# Patient Record
Sex: Female | Born: 1965 | Race: Black or African American | Hispanic: No | Smoking: Never smoker
Health system: Southern US, Community
[De-identification: ages and names within clinical notes are randomized; demographics above are authoritative.]

## PROBLEM LIST (undated history)

## (undated) DIAGNOSIS — T7840XA Allergy, unspecified, initial encounter: Secondary | ICD-10-CM

## (undated) HISTORY — PX: ABDOMINAL HYSTERECTOMY: SHX81

## (undated) HISTORY — DX: Allergy, unspecified, initial encounter: T78.40XA

---

## 2000-12-21 ENCOUNTER — Other Ambulatory Visit: Admission: RE | Admit: 2000-12-21 | Discharge: 2000-12-21 | Payer: Self-pay | Admitting: Obstetrics and Gynecology

## 2003-06-26 ENCOUNTER — Other Ambulatory Visit: Admission: RE | Admit: 2003-06-26 | Discharge: 2003-06-26 | Payer: Self-pay | Admitting: Obstetrics and Gynecology

## 2003-09-28 ENCOUNTER — Ambulatory Visit (HOSPITAL_COMMUNITY): Admission: RE | Admit: 2003-09-28 | Discharge: 2003-09-28 | Payer: Self-pay | Admitting: Obstetrics and Gynecology

## 2005-08-05 ENCOUNTER — Encounter: Admission: RE | Admit: 2005-08-05 | Discharge: 2005-08-05 | Payer: Self-pay | Admitting: Obstetrics and Gynecology

## 2006-09-15 ENCOUNTER — Encounter: Admission: RE | Admit: 2006-09-15 | Discharge: 2006-09-15 | Payer: Self-pay | Admitting: Obstetrics and Gynecology

## 2007-02-24 ENCOUNTER — Encounter (INDEPENDENT_AMBULATORY_CARE_PROVIDER_SITE_OTHER): Payer: Self-pay | Admitting: Obstetrics and Gynecology

## 2007-02-24 ENCOUNTER — Ambulatory Visit (HOSPITAL_COMMUNITY): Admission: RE | Admit: 2007-02-24 | Discharge: 2007-02-25 | Payer: Self-pay | Admitting: Obstetrics and Gynecology

## 2008-07-11 ENCOUNTER — Encounter: Admission: RE | Admit: 2008-07-11 | Discharge: 2008-07-11 | Payer: Self-pay | Admitting: Obstetrics and Gynecology

## 2010-09-17 NOTE — H&P (Signed)
NAME:  Stacy Coffey, [place] NO.:  [id number]   MEDICAL RECORD NO.:  [id number]          PATIENT TYPE:  AMB   LOCATION:  SDC                           FACILITY:  [facility]   PHYSICIAN:  Janine Limbo, M.D.DATE OF BIRTH:  [birth year]   DATE OF ADMISSION:  02/24/2007  DATE OF DISCHARGE:                              HISTORY & PHYSICAL   HISTORY OF PRESENT ILLNESS:  Ms. Standish is a 45 year old female, para 1-0-  0-1, who presents for a laparoscopy-assisted vaginal hysterectomy.  The  patient has a known history of fibroids.  An ultrasound was performed  and it showed a 12.5 x 9.3 x 8.9-cm uterus with multiple fibroids.  The  largest fibroid measured 4.83 cm.  Her ovaries appeared normal.  An  endometrial biopsy showed benign elements.  The patient's most recent  Pap smear was within normal limits.  Her mammogram was normal.  The  patient is ready to proceed with definitive therapy.  She also complains  of dysmenorrhea.  The patient received Lupron Depot therapy.   OBSTETRICAL HISTORY:  The patient has had 1 term cesarean section.   PAST MEDICAL HISTORY:  The patient denies hypertension and diabetes.   MEDICATIONS:  She is currently on no medications.   DRUG ALLERGIES:  No drug allergies, Betadine allergies, or latex  allergies.   SOCIAL HISTORY:  The patient denies cigarette use, alcohol use, and  recreational drug use.   REVIEW OF SYSTEMS:  Please see history of present illness.   FAMILY HISTORY:  The patient's mother has hypertension; her mother also  has diabetes.  Her father had a heart attack.  The patient's maternal  aunt had breast cancer at age 82.   PHYSICAL EXAM:  VITAL SIGNS:  Weight is 138 pounds.  Height is 5 feet 4  inches.  HEENT:  Within normal limits.  CHEST:  Clear.  HEART:  Regular rate and rhythm.  BREASTS:  Without masses.  ABDOMEN:  Nontender.  EXTREMITIES:  Grossly normal.  NEUROLOGIC:  Exam is grossly normal.  PELVIC:  External genitalia  are normal.  Vagina is normal.  Cervix is  nontender.  The uterus is 12-week size and nontender.  There is only  limited mobility of the uterus.  Adnexa:  No masses.  Rectovaginal exam  confirms.   ASSESSMENT:  1. Fibroid uterus.  2. Dysmenorrhea.  3. Menorrhagia.  4. Prior cesarean section.  5. Status post Lupron therapy.   PLAN:  The patient will undergo a laparoscopy-assisted vaginal  hysterectomy.  She understands the indications for her surgical  procedure and she accepts the risks of, but not limited to, anesthetic  complications, bleeding, infection, and possible damage to the  surrounding organs.      Janine Limbo, M.D.  Electronically Signed     AVS/MEDQ  D:  02/17/2007  T:  02/18/2007  Job:  [id number]

## 2010-09-17 NOTE — Op Note (Signed)
NAME:  Stacy Coffey, Stacy Coffey                 ACCOUNT NO.:  [id number]   MEDICAL RECORD NO.:  [id number]          PATIENT TYPE:  OIB   LOCATION:  9310                          FACILITY:  [facility]   PHYSICIAN:  Janine Limbo, M.D.DATE OF BIRTH:  1965-09-12   DATE OF PROCEDURE:  02/24/2007  DATE OF DISCHARGE:                               OPERATIVE REPORT   PREOPERATIVE DIAGNOSES:  1. Fourteen-week-size fibroid uterus.  2. Anemia (hemoglobin 11.9).  3. Dysmenorrhea.  4. Menorrhagia.   POSTOPERATIVE DIAGNOSES:  1. Fourteen-week-size fibroid uterus.  2. Anemia (hemoglobin 11.9).  3. Dysmenorrhea.  4. Menorrhagia.   PROCEDURE:  Laparoscopy-assisted vaginal hysterectomy with the uterine  morcellation.   SURGEON:  Leonard Schwartz, MD   FIRST ASSISTANT:  Naima A. Dillard, MD   ANESTHETIC:  General.   DISPOSITION:  Stacy Coffey is a 45 year old female, para 1-0-0-1, who  presents with the above-mentioned diagnosis.  The patient has had one  prior cesarean section.  The patient was treated with Lupron Depot in  preparation for her surgery.  The patient understands the indications  for her surgical procedure and she accepts the risks of, but not limited  to, anesthetic complications, bleeding, infections, and possible damage  to the surrounding organs.   FINDINGS:  A 14-week-size multifibroid uterus was present.  The  estimated uterine weight was 286 g.  The fallopian tubes and ovaries  appeared normal.  The bowel and the remainder of the abdominal contents  appeared normal.   PROCEDURE:  The patient was taken to the operating room, where a general  anesthetic was given.  The patient's abdomen, perineum, and vagina were  prepped with multiple layers of Betadine.  A Foley catheter was placed  in the bladder.  Examination under anesthesia was performed.  A Hulka  tenaculum was placed inside the uterus.  The patient was then sterilely  draped.  The subumbilical area was injected with 4 mL  of 0.5% Marcaine  with epinephrine.  A subumbilical incision was made and the Veress  needle was inserted into the abdominal cavity without difficulty; proper  placement was confirmed using the saline drop test.  A pneumoperitoneum  was then obtained.  A total of 5 mL were used to inject the lower  abdomen with 0.5% Marcaine with epinephrine.  Two small incisions were  made two 5-mm trocars were placed in the lower abdomen under direct  visualization.  Care was taken not to damage any of the abdominal  structures.  There was no evidence of damage to the bowel or other  abdominal contents.  Pictures were taken of the patient's abdominal and  pelvic organs.  The round ligament was identified on the right.  The  round ligament was cauterized and then cut using the bipolar cautery.  The right fallopian tube and the right utero-ovarian ligament were  isolated, cauterized, and then cut.  The bladder flap was developed  anteriorly using the laparoscopic scissors.  An identical procedure was  carried out on the opposite side.  Hemostasis was noted to be adequate.  We had already documented that  the posterior cul-de-sac was free; we  were ready then to proceed with the vaginal portion of the procedure.  The patient was placed in a more lithotomy position.  The cervix was  injected with 20 mL of a diluted solution of Pitressin and saline.  A  circumferential incision was made around the cervix and the vaginal  mucosa was advanced anteriorly and posteriorly.  The anterior cul-de-sac  and then the posterior cul-de-sac were sharply entered.  Alternating  from right to left, the uterosacral ligaments, paracervical tissues,  parametrial tissues, and uterine arteries were clamped, cut, sutured,  and tied securely.  The patient was noted to have a narrow pelvic  outlet.  Attempts were made to invert the uterus through the posterior  colpotomy, but these were unsuccessful.  We therefore amputated the   cervix and began to morcellate the uterus so that we could remove the  uterus.  Hemostasis was maintained throughout the procedure.  We were  able to finally invert the uterus through the posterior colpotomy and  the remaining tissue was clamped and cut.  The uterus was completely  removed.  The upper pedicles were secured using free ties and suture  ligatures of 0 Vicryl.  A check was made for hemostasis and hemostasis  was adequate throughout.  The sutures attached to the uterosacral  ligaments were brought out through the vaginal angles and tied securely.  A McCall culdoplasty suture was placed in the posterior cul-de-sac,  incorporating the uterosacral ligaments bilaterally and the posterior  peritoneum.  The vaginal cuff was closed using figure-of-eight sutures,  incorporating the anterior vaginal mucosa, the anterior peritoneum, the  posterior peritoneum, and the posterior vaginal mucosa.  The [place] cul-  de-sac culdoplasty suture was tied securely and the apex of the vagina  was noted to elevate into the mid pelvis.  Sponge, needle, and  instrument counts were correct on 2 occasions.  The estimated blood loss  for the procedure was 150 mL.  The operator then changed gown and  gloves.  The pneumoperitoneum was reestablished and the pelvis was  carefully inspected.  The pelvis was irrigated.  Hemostasis was  adequate.  The suprapubic trocars were removed under direct  visualization.  The subumbilical trocar was then removed after the  pneumoperitoneum was allowed to escape.  The fascia of the subumbilical  incision was closed using interrupted sutures of 0 Vicryl.  The skin of  all incisions were closed using 4-0 Monocryl.  The patient tolerated her  procedure well.  She was noted to drain clear yellow urine at the end of  her procedure.  The patient was taken to the recovery room in stable  condition.  She tolerated her procedure well.  There was some elevation  in her blood  pressure.  The uterus was sent to Pathology.      Janine Limbo, M.D.  Electronically Signed     AVS/MEDQ  D:  02/24/2007  T:  02/25/2007  Job:  [id number]

## 2010-09-20 NOTE — Discharge Summary (Signed)
NAME:  Stacy Coffey, Stacy Coffey                 ACCOUNT NO.:  [id number]   MEDICAL RECORD NO.:  [id number]          PATIENT TYPE:  OIB   LOCATION:  9310                          [facility]ACILITY:  [facility]   PHYSICIAN:  Janine Limbo, M.D.DATE OF BIRTH:  February 08, 1966   DATE OF ADMISSION:  02/24/2007  DATE OF DISCHARGE:  02/25/2007                               DISCHARGE SUMMARY   DISCHARGE DIAGNOSIS:  1. Symptomatic uterine fibroids.  2. Anemia (hemoglobin 11.9).  3. Dysmenorrhea and menorrhagia.   OPERATION:  On the day of admission, the patient underwent a  laparoscopically-assisted vaginal hysterectomy, tolerating procedures  well.  The patient had removed a 286 gram multifibroid uterus which was  sent to pathology.   HISTORY OF PRESENT ILLNESS:  Stacy Coffey is a 45 year old female, para 1-0-  0-1, who presents for hysterectomy because of symptomatic uterine  fibroids.  Please see the patient's dictated History and Physical  examination for details.   Preoperative physical exam, weight is 138 pounds, height is 5 feet 4  inches.  General exam was within normal limits.  Pelvic exam:  External  genitalia was normal.  Vagina was normal. Cervix was nontender.  Uterus  was 12-week size and nontender.  There was limited mobility of the  uterus noted. Her adnexa was without masses, and her rectovaginal exam  confirms.   HOSPITAL COURSE:  On the day of admission, the patient underwent  aforementioned procedures, tolerating them all well.  By postop day #1,  the patient had resumed bowel and bladder function and was tolerating a  hemoglobin of 9.16 (preop hemoglobin 11.9) and, therefore, was deemed  ready for discharge home.   DISCHARGE MEDICATIONS:  1. Vicodin one to two tablets every 4 hours as needed for pain.  2. Motrin 800 mg 1 tablet every 8 hours with food as needed for pain.  3. Zofran 4 mg 1 tablet every 4 hours as needed for nausea.  4. Iron 325 mg 1 tablet twice daily for 6 weeks.   FOLLOWUP:   The patient is to call [facility] at [phone] to  schedule a 6-week postoperative visit with Dr. Stefano Gaul   DISCHARGE INSTRUCTIONS:  The patient was given a copy of [facility] postoperative instruction sheet.  She was further  advised to avoid driving for 1 week, heavy lifting for 4 weeks,  intercourse for 6  weeks.  She may shower.  She may walk up steps.  She may increase her  activities slowly.  Her diet was without restriction.   Final pathology was not available at the time of this dictation.      Stacy Coffey.      Janine Limbo, M.D.  Electronically Signed    EJP/MEDQ  D:  03/22/2007  T:  03/22/2007  Job:  [id number]

## 2011-02-12 LAB — CBC
HCT: 37.1
MCHC: 32.2
MCHC: 32.4
MCV: 71.3 — ABNORMAL LOW
RBC: 5.2 — ABNORMAL HIGH
RDW: 21.9 — ABNORMAL HIGH
WBC: 10.2

## 2011-02-12 LAB — URINALYSIS, ROUTINE W REFLEX MICROSCOPIC
Hgb urine dipstick: NEGATIVE
Nitrite: NEGATIVE
Protein, ur: NEGATIVE
Specific Gravity, Urine: 1.02

## 2011-02-12 LAB — PREGNANCY, URINE: Preg Test, Ur: NEGATIVE

## 2011-05-15 ENCOUNTER — Other Ambulatory Visit: Payer: Self-pay | Admitting: Obstetrics and Gynecology

## 2011-05-15 DIAGNOSIS — Z1231 Encounter for screening mammogram for malignant neoplasm of breast: Secondary | ICD-10-CM

## 2011-05-27 ENCOUNTER — Ambulatory Visit
Admission: RE | Admit: 2011-05-27 | Discharge: 2011-05-27 | Disposition: A | Discharge status: Home / Self Care | Payer: BC Managed Care – PPO | Source: Ambulatory Visit | Attending: Obstetrics and Gynecology | Admitting: Obstetrics and Gynecology

## 2011-05-27 DIAGNOSIS — Z1231 Encounter for screening mammogram for malignant neoplasm of breast: Secondary | ICD-10-CM

## 2012-06-07 ENCOUNTER — Other Ambulatory Visit: Payer: Self-pay | Admitting: Obstetrics and Gynecology

## 2012-06-07 DIAGNOSIS — Z1231 Encounter for screening mammogram for malignant neoplasm of breast: Secondary | ICD-10-CM

## 2012-06-28 ENCOUNTER — Ambulatory Visit
Admission: RE | Admit: 2012-06-28 | Discharge: 2012-06-28 | Disposition: A | Discharge status: Home / Self Care | Payer: BC Managed Care – PPO | Source: Ambulatory Visit | Attending: Obstetrics and Gynecology | Admitting: Obstetrics and Gynecology

## 2012-07-13 ENCOUNTER — Encounter: Payer: Self-pay | Admitting: Family Medicine

## 2012-07-13 ENCOUNTER — Ambulatory Visit: Payer: BC Managed Care – PPO | Admitting: Family Medicine

## 2012-07-13 VITALS — BP 100/82 | HR 68 | Resp 16 | Ht 64.0 in | Wt 140.0 lb

## 2012-07-13 DIAGNOSIS — Z0000 Encounter for general adult medical examination without abnormal findings: Secondary | ICD-10-CM

## 2012-07-13 DIAGNOSIS — N951 Menopausal and female climacteric states: Secondary | ICD-10-CM

## 2012-07-13 MED ORDER — ESTRADIOL 0.5 MG PO TABS: 0.5000 mg | ORAL_TABLET | Freq: Every day | ORAL | Status: DC

## 2012-07-13 NOTE — Patient Instructions (Addendum)
Menopause Menopause is the normal time of life when menstrual periods stop completely. Menopause is complete when you have missed 12 consecutive menstrual periods. It usually occurs between the ages of 48 to 55, with an average age of 51. Very rarely does a woman develop menopause before 48 years old. At menopause, your ovaries stop producing the female hormones, estrogen and progesterone. This can cause undesirable symptoms and also affect your health. Sometimes the symptoms may occur 4 to 5 years before the menopause begins. There is no relationship between menopause and:  Oral contraceptives.  Number of children you had.  Race.  The age your menstrual periods started (menarche). Heavy smokers and very thin women may develop menopause earlier in life. CAUSES  The ovaries stop producing the female hormones estrogen and progesterone.  Other causes include:  Surgery to remove both ovaries.  The ovaries stop functioning for no known reason.  Tumors of the pituitary gland in the brain.  Medical disease that affects the ovaries and hormone production.  Radiation treatment to the abdomen or pelvis.  Chemotherapy that affects the ovaries. SYMPTOMS   Hot flashes.  Night sweats.  Decrease in sex drive.  Vaginal dryness and thinning of the vagina causing painful intercourse.  Dryness of the skin and developing wrinkles.  Headaches.  Tiredness.  Irritability.  Memory problems.  Weight gain.  Bladder infections.  Hair growth of the face and chest.  Infertility. More serious symptoms include:  Loss of bone (osteoporosis) causing breaks (fractures).  Depression.  Hardening and narrowing of the arteries (atherosclerosis) causing heart attacks and strokes. DIAGNOSIS   When the menstrual periods have stopped for 12 straight months.  Physical exam.  Hormone studies of the blood. TREATMENT  There are many treatment choices and nearly as many questions about them.  The decisions to treat or not to treat menopausal changes is an individual choice made with your caregiver. Your caregiver can discuss the treatments with you. Together, you can decide which treatment will work best for you. Your treatment choices may include:   Hormone therapy (estorgen and progesterone).  Non-hormonal medications.  Treating the individual symptoms with medication (for example antidepressants for depression).  Herbal medications that may help specific symptoms.  Counseling by a psychiatrist or psychologist.  Group therapy.  Lifestyle changes including:  Eating healthy.  Regular exercise.  Limiting caffeine and alcohol.  Stress management and meditation.  No treatment. HOME CARE INSTRUCTIONS   Take the medication your caregiver gives you as directed.  Get plenty of sleep and rest.  Exercise regularly.  Eat a diet that contains calcium (good for the bones) and soy products (acts like estrogen hormone).  Avoid alcoholic beverages.  Do not smoke.  If you have hot flashes, dress in layers.  Take supplements, calcium and vitamin D to strengthen bones.  You can use over-the-counter lubricants or moisturizers for vaginal dryness.  Group therapy is sometimes very helpful.  Acupuncture may be helpful in some cases. SEEK MEDICAL CARE IF:   You are not sure you are in menopause.  You are having menopausal symptoms and need advice and treatment.  You are still having menstrual periods after age 55.  You have pain with intercourse.  Menopause is complete (no menstrual period for 12 months) and you develop vaginal bleeding.  You need a referral to a specialist (gynecologist, psychiatrist or psychologist) for treatment. SEEK IMMEDIATE MEDICAL CARE IF:   You have severe depression.  You have excessive vaginal bleeding.  You fell and   think you have a broken bone.  You have pain when you urinate.  You develop leg or chest pain.  You have a fast  pounding heart beat (palpitations).  You have severe headaches.  You develop vision problems.  You feel a lump in your breast.  You have abdominal pain or severe indigestion. Document Released: 07/12/2003 Document Revised: 07/14/2011 Document Reviewed: 02/17/2008 [facility] Patient Information 2013 [place], [place].    Estradiol tablets What is this medicine? ESTRADIOL (es tra DYE ole) is an estrogen. It is mostly used as hormone replacement in menopausal women. It helps to treat hot flashes and prevent osteoporosis. It is also used to treat women with low estrogen levels or those who have had their ovaries removed. This medicine may be used for other purposes; ask your health care provider or pharmacist if you have questions. What should I tell my health care provider before I take this medicine? They need to know if you have or ever had any of these conditions: -abnormal vaginal bleeding -blood vessel disease or blood clots -breast, cervical, endometrial, ovarian, liver, or uterine cancer -dementia -diabetes -gallbladder disease -heart disease or recent heart attack -high blood pressure -high cholesterol -high level of calcium in the blood -hysterectomy -kidney disease -liver disease -migraine headaches -protein C deficiency -protein S deficiency -stroke -systemic lupus erythematosus (SLE) -tobacco smoker -an unusual or allergic reaction to estrogens, other hormones, medicines, foods, dyes, or preservatives -pregnant or trying to get pregnant -breast-feeding How should I use this medicine? Take this medicine by mouth. To reduce nausea, this medicine may be taken with food. Follow the directions on the prescription label. Take this medicine at the same time each day and in the order directed on the package. Do not take your medicine more often than directed. Contact your pediatrician regarding the use of this medicine in children. Special care may be needed. A patient  package insert for the product will be given with each prescription and refill. Read this sheet carefully each time. The sheet may change frequently. Overdosage: If you think you have taken too much of this medicine contact a poison control center or emergency room at once. NOTE: This medicine is only for you. Do not share this medicine with others. What if I miss a dose? If you miss a dose, take it as soon as you can. If it is almost time for your next dose, take only that dose. Do not take double or extra doses. What may interact with this medicine? Do not take this medicine with any of the following medications: -aromatase inhibitors like aminoglutethimide, anastrozole, exemestane, letrozole, testolactone This medicine may also interact with the following medications: -carbamazepine -certain antibiotics used to treat infections -certain barbiturates or benzodiazepines used for inducing sleep or treating seizures -grapefruit juice -medicines for fungus infections like itraconazole and ketoconazole -raloxifene or tamoxifen -rifabutin, rifampin, or rifapentine -ritonavir -St. John's Wort -warfarin This list may not describe all possible interactions. Give your health care provider a list of all the medicines, herbs, non-prescription drugs, or dietary supplements you use. Also tell them if you smoke, drink alcohol, or use illegal drugs. Some items may interact with your medicine. What should I watch for while using this medicine? Visit your doctor or health care professional for regular checks on your progress. You will need a regular breast and pelvic exam and Pap smear while on this medicine. You should also discuss the need for regular mammograms with your health care professional, and follow his or her  guidelines for these tests. This medicine can make your body retain fluid, making your fingers, hands, or ankles swell. Your blood pressure can go up. Contact your doctor or health care  professional if you feel you are retaining fluid. If you have any reason to think you are pregnant, stop taking this medicine right away and contact your doctor or health care professional. Smoking increases the risk of getting a blood clot or having a stroke while you are taking this medicine, especially if you are more than 48 years old. You are strongly advised not to smoke. If you wear contact lenses and notice visual changes, or if the lenses begin to feel uncomfortable, consult your eye doctor or health care professional. This medicine can increase the risk of developing a condition (endometrial hyperplasia) that may lead to cancer of the lining of the uterus. Taking progestins, another hormone drug, with this medicine lowers the risk of developing this condition. Therefore, if your uterus has not been removed (by a hysterectomy), your doctor may prescribe a progestin for you to take together with your estrogen. You should know, however, that taking estrogens with progestins may have additional health risks. You should discuss the use of estrogens and progestins with your health care professional to determine the benefits and risks for you. If you are going to have surgery, you may need to stop taking this medicine. Consult your health care professional for advice before you schedule the surgery. What side effects may I notice from receiving this medicine? Side effects that you should report to your doctor or health care professional as soon as possible: -allergic reactions like skin rash, itching or hives, swelling of the face, lips, or tongue -breast tissue changes or discharge -changes in vision -chest pain -confusion, trouble speaking or understanding -dark urine -general ill feeling or flu-like symptoms -light-colored stools -nausea, vomiting -pain, swelling, warmth in the leg -right upper belly pain -severe headaches -shortness of breath -sudden numbness or weakness of the face, arm  or leg -trouble walking, dizziness, loss of balance or coordination -unusual vaginal bleeding -yellowing of the eyes or skin Side effects that usually do not require medical attention (report to your doctor or health care professional if they continue or are bothersome): -hair loss -increased hunger or thirst -increased urination -symptoms of vaginal infection like itching, irritation or unusual discharge -unusually weak or tired This list may not describe all possible side effects. Call your doctor for medical advice about side effects. You may report side effects to FDA at 1-800-FDA-1088. Where should I keep my medicine? Keep out of the reach of children. Store at room temperature between 20 and 25 degrees C (68 and 77 degrees F). Keep container tightly closed. Protect from light. Throw away any unused medicine after the expiration date. NOTE: This sheet is a summary. It may not cover all possible information. If you have questions about this medicine, talk to your doctor, pharmacist, or health care provider.  2012, [employer]. (07/24/2010 9:19:24 AM)

## 2012-07-13 NOTE — Progress Notes (Signed)
Last Pap :2008 ? WNL: Yes Regular Periods:Hysterectomy Contraception: N/A  Monthly Breast exam:yes Tetanus<2yrs:no Nl.Bladder Function:yes Daily BMs:yes Healthy Diet:yes Calcium:yes Mammogram:yes Date of Mammogram:06/2012 WNL Exercise:Yes How often Exercise: 2 x wk Seat belt: yes Abuse at home: no Stressful work:no Sigmoid-colonoscopy: None Bone Density: No PCP: None Change PMH: None Change in ZOX:WRUE  Subjective:    Stacy Coffey is a 47 y.o. female, No obstetric history on file., who presents for an annual exam. The patient reports hot/cold flashes, night sweats, mood swings, and insomnia x 3 years. Denies vaginal dryness or dyspareunia, decreased libido.     Menstrual cycle:   LMP: No LMP recorded. Patient has had a hysterectomy.             Review of Systems Pertinent items are noted in HPI (negative for hx of CVD, cholesteremia, breast cancer, thrombosis, endometrial carcinoma, liver disease). Denies pelvic pain, urinary tract symptoms, vaginitis symptoms, irregular bleeding, change in bowel habits or rectal bleeding.  Mammogram 06/2012 and negative per patient.     Objective:    BP 100/82  Pulse 68  Resp 16  Ht 5\' 4"  (1.626 m)  Wt 140 lb (63.504 kg)  BMI 24.02 kg/m2   Wt Readings from Last 1 Encounters:  07/13/12 140 lb (63.504 kg)   Body mass index is 24.02 kg/(m^2). General Appearance: Alert, no acute distress HEENT: Grossly normal Neck / Thyroid: Supple, no thyromegaly or cervical adenopathy Lungs: Clear to auscultation bilaterally Back: No CVA tenderness Breast Exam: No masses or nodes.  No dimpling, nipple retraction or discharge.  Left breast thickening at 5 o'clock, non tender. Cardiovascular: Regular rate and rhythm. S1+S2 present without M/G/R. Gastrointestinal: Soft, non-tender, no masses or organomegaly. Pelvic Exam: Patient shaves, sparse amount of grey hair on labia majoria,  EGBUS-wnl, vagina-decreased rugae, cervix-absent (surgically removed),   Uterus-absent (surgically removed), adnexae-no masses or tenderness, vaginal thin, pale mucosa. Rectovaginal: no masses and normal sphincter tone Lymphatic Exam: Non-palpable nodes in neck, clavicular,  axillary, or inguinal regions  Skin: no rashes or abnormalities Extremities: no clubbing cyanosis or edema  Neurologic: grossly normal Psychiatric: Alert and oriented    Assessment:   1. Routine GYN Exam 2. Menopause   Plan:    1. AEX in 1 year 2. RTO 12 weeks for f/u menopause, TSH, FSH, LH, Lipid panel, and Vitamin D pending.    CARTER, LAKASHAFNP-BC

## 2012-07-14 ENCOUNTER — Telehealth: Payer: Self-pay | Admitting: Obstetrics and Gynecology

## 2012-07-14 LAB — LIPID PANEL
Cholesterol: 218 mg/dL — ABNORMAL HIGH (ref 0–200)
HDL: 79 mg/dL (ref >39)
LDL Cholesterol: 128 mg/dL — ABNORMAL HIGH (ref 0–99)
Total CHOL/HDL Ratio: 2.8 ratio
Triglycerides: 57 mg/dL (ref <150)
VLDL: 11 mg/dL (ref 0–40)

## 2012-07-14 LAB — LUTEINIZING HORMONE: LH: 54.3 m[IU]/mL

## 2012-07-14 LAB — FOLLICLE STIMULATING HORMONE: FSH: 108.4 m[IU]/mL

## 2012-07-14 LAB — TSH: TSH: 1.468 u[IU]/mL (ref 0.350–4.500)

## 2012-07-14 NOTE — Telephone Encounter (Signed)
TC to pt. Per [facility] informed lab indicates is menopausal and to continue  Estradiol. Also informed cholesterol slightly elevated and discussed lifestyle changes and diet. Pt will obtain additional info on line. To repeat labs at [facility]. Pt verbalizes comprehension.

## 2012-07-14 NOTE — Telephone Encounter (Signed)
Message copied by Mason Jim on Wed Jul 14, 2012  9:31 AM ------      Message from: Elane Fritz      Created: Wed Jul 14, 2012  8:43 AM       1. Labs confirm menopause. Continue her estradiol.            2. Pt cholesterol levels slightly elevated.  Encourage lifestyle modifications (exercising, limit fats, sweets, and high-fat desserts).  If she will like we can mail her some information on high cholesterol.  Will recheck labs at next visit.            L.Carter, FNP-BC ------

## 2013-08-19 ENCOUNTER — Other Ambulatory Visit: Payer: Self-pay

## 2013-08-19 DIAGNOSIS — Z1231 Encounter for screening mammogram for malignant neoplasm of breast: Secondary | ICD-10-CM

## 2013-08-29 ENCOUNTER — Ambulatory Visit
Admission: RE | Admit: 2013-08-29 | Discharge: 2013-08-29 | Disposition: A | Discharge status: Home / Self Care | Payer: 59 | Source: Ambulatory Visit

## 2013-08-29 ENCOUNTER — Encounter (INDEPENDENT_AMBULATORY_CARE_PROVIDER_SITE_OTHER): Payer: Self-pay

## 2013-08-29 DIAGNOSIS — Z1231 Encounter for screening mammogram for malignant neoplasm of breast: Secondary | ICD-10-CM

## 2014-02-17 ENCOUNTER — Other Ambulatory Visit: Payer: Self-pay

## 2015-06-25 ENCOUNTER — Other Ambulatory Visit: Payer: Self-pay

## 2015-06-25 DIAGNOSIS — Z1231 Encounter for screening mammogram for malignant neoplasm of breast: Secondary | ICD-10-CM

## 2015-07-10 ENCOUNTER — Ambulatory Visit
Admission: RE | Admit: 2015-07-10 | Discharge: 2015-07-10 | Disposition: A | Discharge status: Home / Self Care | Payer: BLUE CROSS/BLUE SHIELD | Source: Ambulatory Visit

## 2015-07-10 DIAGNOSIS — Z1231 Encounter for screening mammogram for malignant neoplasm of breast: Secondary | ICD-10-CM

## 2016-09-30 ENCOUNTER — Encounter: Payer: Self-pay | Admitting: Gastroenterology

## 2016-10-07 ENCOUNTER — Ambulatory Visit (AMBULATORY_SURGERY_CENTER): Payer: Self-pay | Admitting: *Deleted

## 2016-10-07 ENCOUNTER — Encounter: Payer: Self-pay | Admitting: Gastroenterology

## 2016-10-07 VITALS — Ht 64.5 in | Wt 131.0 lb

## 2016-10-07 DIAGNOSIS — Z1211 Encounter for screening for malignant neoplasm of colon: Secondary | ICD-10-CM

## 2016-10-07 MED ORDER — NA SULFATE-K SULFATE-MG SULF 17.5-3.13-1.6 GM/177ML PO SOLN: ORAL | 0 refills | Status: DC

## 2016-10-07 NOTE — Progress Notes (Signed)
Pt denies allergies to eggs or soy products. Denies difficulty with sedation or anesthesia. Denies any diet or weight loss medications. Denies use of supplemental oxygen.  Emmi instructions given for procedure.  

## 2016-10-20 ENCOUNTER — Encounter: Payer: Self-pay | Admitting: Gastroenterology

## 2016-10-20 ENCOUNTER — Ambulatory Visit (AMBULATORY_SURGERY_CENTER): Payer: BLUE CROSS/BLUE SHIELD | Admitting: Gastroenterology

## 2016-10-20 VITALS — BP 120/78 | HR 54 | Temp 97.8°F | Resp 12 | Ht 64.5 in | Wt 131.0 lb

## 2016-10-20 DIAGNOSIS — Z1212 Encounter for screening for malignant neoplasm of rectum: Secondary | ICD-10-CM | POA: Diagnosis not present

## 2016-10-20 DIAGNOSIS — Z1211 Encounter for screening for malignant neoplasm of colon: Secondary | ICD-10-CM | POA: Diagnosis not present

## 2016-10-20 MED ORDER — SODIUM CHLORIDE 0.9 % IV SOLN: 500.0000 mL | INTRAVENOUS | Status: AC

## 2016-10-20 NOTE — Progress Notes (Signed)
No problems noted in the recovery room. maw 

## 2016-10-20 NOTE — Progress Notes (Signed)
Report given to PACU, vss 

## 2016-10-20 NOTE — Progress Notes (Signed)
Pt's states no medical or surgical changes since previsit or office visit. 

## 2016-10-20 NOTE — Op Note (Signed)
[facility] Patient Name: Stacy Coffey Procedure Date: 10/20/2016 8:20 AM MRN: [id number] Endoscopist: Sherilyn Cooter L. Myrtie Neither , MD Age: 51 Referring MD:  Date of Birth: 1965-06-16 Gender: Female Account #: [id number] Procedure:                Colonoscopy Indications:              Screening for colorectal malignant neoplasm, This                            is the patient's first colonoscopy Medicines:                Monitored Anesthesia Care Procedure:                Pre-Anesthesia Assessment:                           - Prior to the procedure, a History and Physical                            was performed, and patient medications and                            allergies were reviewed. The patient's tolerance of                            previous anesthesia was also reviewed. The risks                            and benefits of the procedure and the sedation                            options and risks were discussed with the patient.                            All questions were answered, and informed consent                            was obtained. Prior Anticoagulants: The patient has                            taken no previous anticoagulant or antiplatelet                            agents. ASA Grade Assessment: I - A normal, healthy                            patient. After reviewing the risks and benefits,                            the patient was deemed in satisfactory condition to                            undergo the procedure.  After obtaining informed consent, the colonoscope                            was passed under direct vision. Throughout the                            procedure, the patient's blood pressure, pulse, and                            oxygen saturations were monitored continuously. The                            Colonoscope was introduced through the anus and                            advanced to the the cecum, identified by                             appendiceal orifice and ileocecal valve. The                            ileocecal valve, appendiceal orifice, and rectum                            were photographed. The quality of the bowel                            preparation was excellent. The colonoscopy was                            performed without difficulty. The patient tolerated                            the procedure well. The bowel preparation used was                            SUPREP. Scope In: 8:24:18 AM Scope Out: 8:38:12 AM Scope Withdrawal Time: 0 hours 9 minutes 19 seconds  Total Procedure Duration: 0 hours 13 minutes 54 seconds  Findings:                 The entire examined colon appeared normal on direct                            and retroflexion views. Complications:            No immediate complications. Estimated blood loss:                            None. Estimated Blood Loss:     Estimated blood loss: none. Recommendation:           - Repeat colonoscopy in 10 years for screening                            purposes.                           -  Patient has a contact number available for                            emergencies. The signs and symptoms of potential                            delayed complications were discussed with the                            patient. Return to normal activities tomorrow.                            Written discharge instructions were provided to the                            patient.                           - Resume previous diet.                           - Continue present medications. Bradely Rudin L. Myrtie Neither, MD 10/20/2016 8:41:35 AM This report has been signed electronically.

## 2016-10-20 NOTE — Patient Instructions (Signed)
YOU HAD AN ENDOSCOPIC PROCEDURE TODAY AT THE [facility]:   Refer to the procedure report that was given to you for any specific questions about what was found during the examination.  If the procedure report does not answer your questions, please call your gastroenterologist to clarify.  If you requested that your care partner not be given the details of your procedure findings, then the procedure report has been included in a sealed envelope for you to review at your convenience later.  YOU SHOULD EXPECT: Some feelings of bloating in the abdomen. Passage of more gas than usual.  Walking can help get rid of the air that was put into your GI tract during the procedure and reduce the bloating. If you had a lower endoscopy (such as a colonoscopy or flexible sigmoidoscopy) you may notice spotting of blood in your stool or on the toilet paper. If you underwent a bowel prep for your procedure, you may not have a normal bowel movement for a few days.  Please Note:  You might notice some irritation and congestion in your nose or some drainage.  This is from the oxygen used during your procedure.  There is no need for concern and it should clear up in a day or so.  SYMPTOMS TO REPORT IMMEDIATELY:   Following lower endoscopy (colonoscopy or flexible sigmoidoscopy):  Excessive amounts of blood in the stool  Significant tenderness or worsening of abdominal pains  Swelling of the abdomen that is new, acute  Fever of 100F or higher   For urgent or emergent issues, a gastroenterologist can be reached at any hour by calling [phone].   DIET:  We do recommend a small meal at first, but then you may proceed to your regular diet.  Drink plenty of fluids but you should avoid alcoholic beverages for 24 hours.  ACTIVITY:  You should plan to take it easy for the rest of today and you should NOT DRIVE or use heavy machinery until tomorrow (because of the sedation medicines used during the test).     FOLLOW UP: Our staff will call the number listed on your records the next business day following your procedure to check on you and address any questions or concerns that you may have regarding the information given to you following your procedure. If we do not reach you, we will leave a message.  However, if you are feeling well and you are not experiencing any problems, there is no need to return our call.  We will assume that you have returned to your regular daily activities without incident.  If any biopsies were taken you will be contacted by phone or by letter within the next 1-3 weeks.  Please call us at [phone] if you have not heard about the biopsies in 3 weeks.    SIGNATURES/CONFIDENTIALITY: You and/or your care partner have signed paperwork which will be entered into your electronic medical record.  These signatures attest to the fact that that the information above on your After Visit Summary has been reviewed and is understood.  Full responsibility of the confidentiality of this discharge information lies with you and/or your care-partner.    Repeat screening colonoscopy in 10 years. You may resume your current medications today. Please call if any questions or concerns.

## 2016-10-21 ENCOUNTER — Telehealth: Payer: Self-pay

## 2016-10-21 NOTE — Telephone Encounter (Signed)
  Follow up Call-  Call back number 10/20/2016  Post procedure Call Back phone  # [phone]  Permission to leave phone message Yes  Some recent data might be hidden     Patient questions:  Do you have a fever, pain , or abdominal swelling? No. Pain Score  0 *  Have you tolerated food without any problems? Yes.    Have you been able to return to your normal activities? Yes.    Do you have any questions about your discharge instructions: Diet   No. Medications  No. Follow up visit  No.  Do you have questions or concerns about your Care? No.  Actions: * If pain score is 4 or above: No action needed, pain <4.  No problems noted per pt. maw

## 2016-11-21 IMAGING — MG MM SCREEN MAMMOGRAM BILATERAL
6 series · 6 of 6 positions shown · non-contrast
Comparison: Previous exam(s).

CLINICAL DATA: Screening.

EXAM:
DIGITAL SCREENING BILATERAL MAMMOGRAM WITH CAD

[R CC]
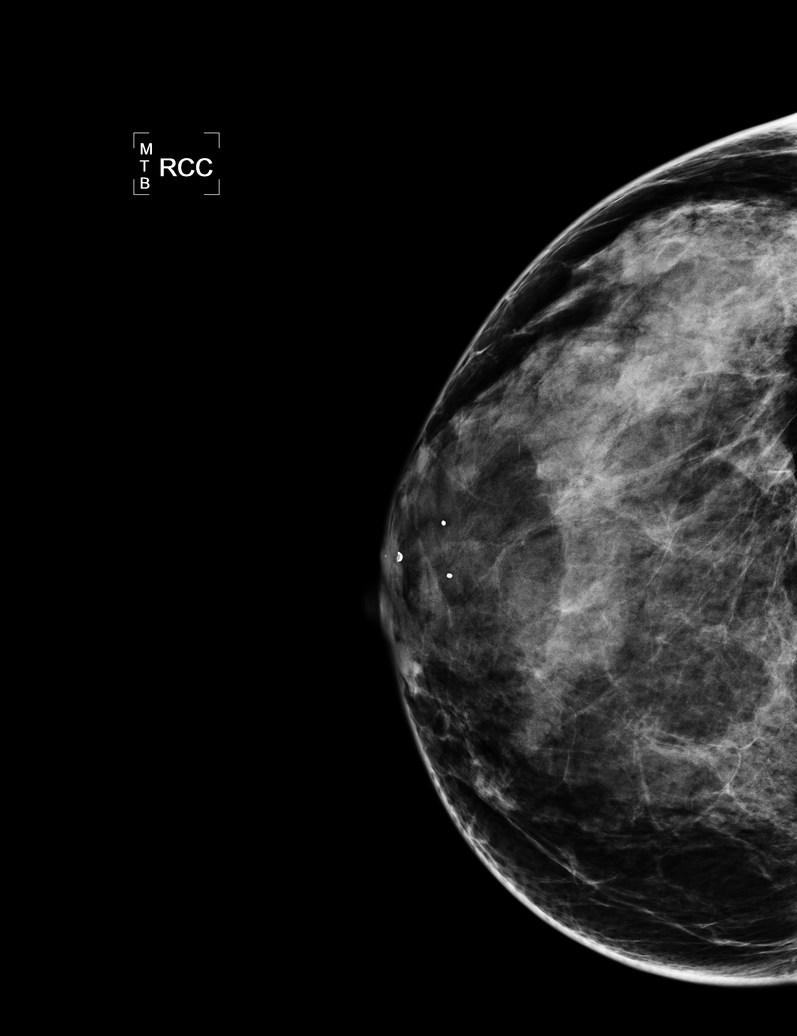

[L CC]
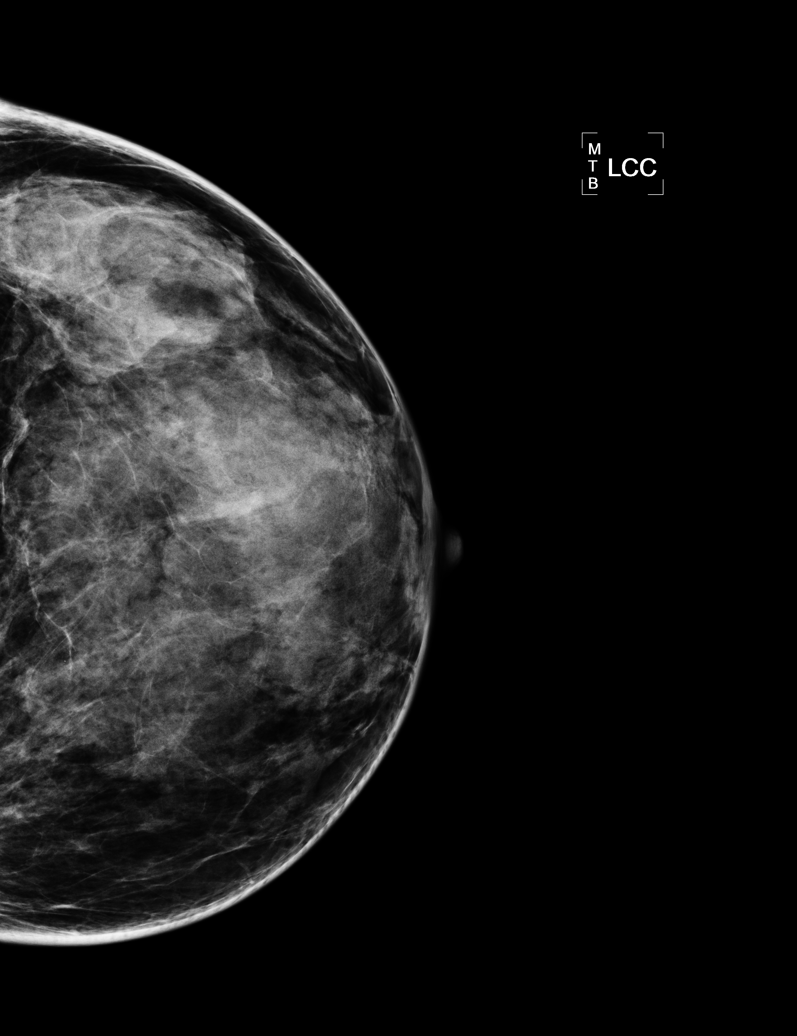

[L MLO (1 of 2)]
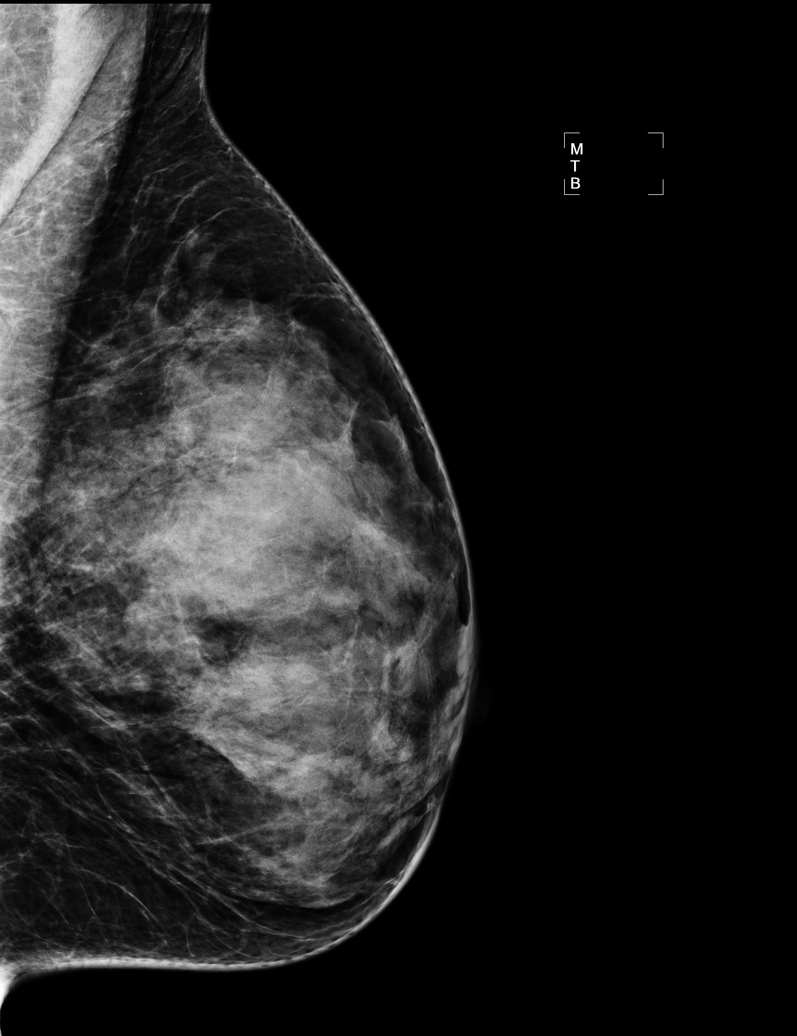

[R MLO (1 of 2)]
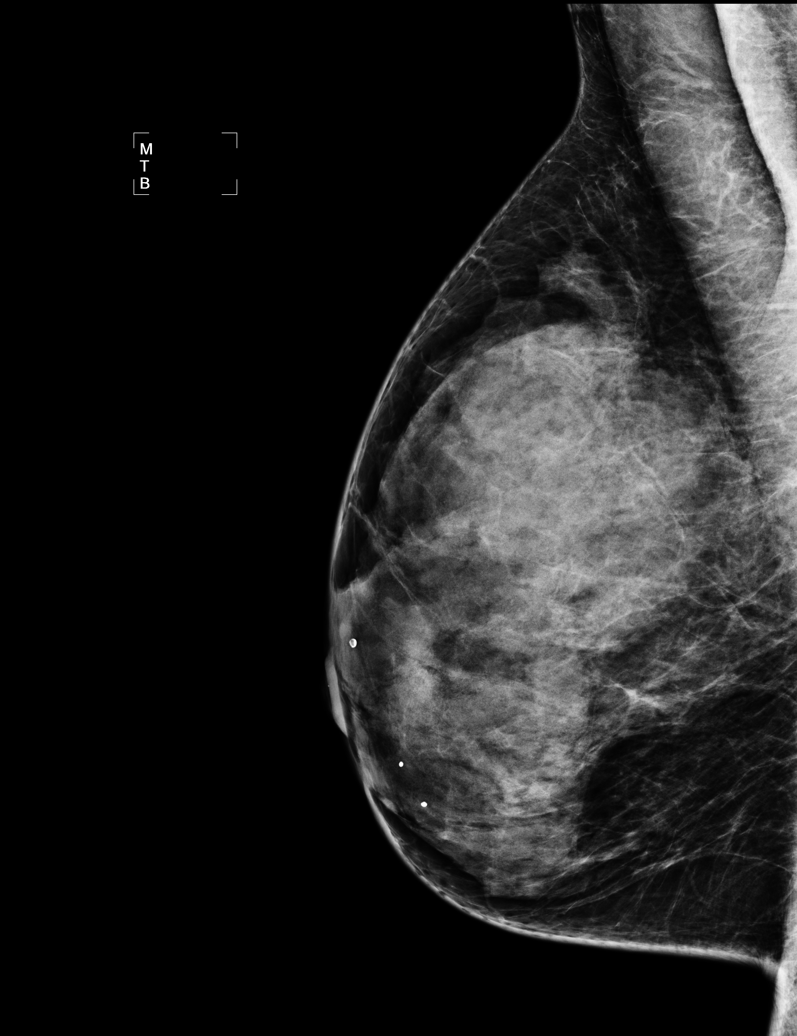

[R MLO (2 of 2)]
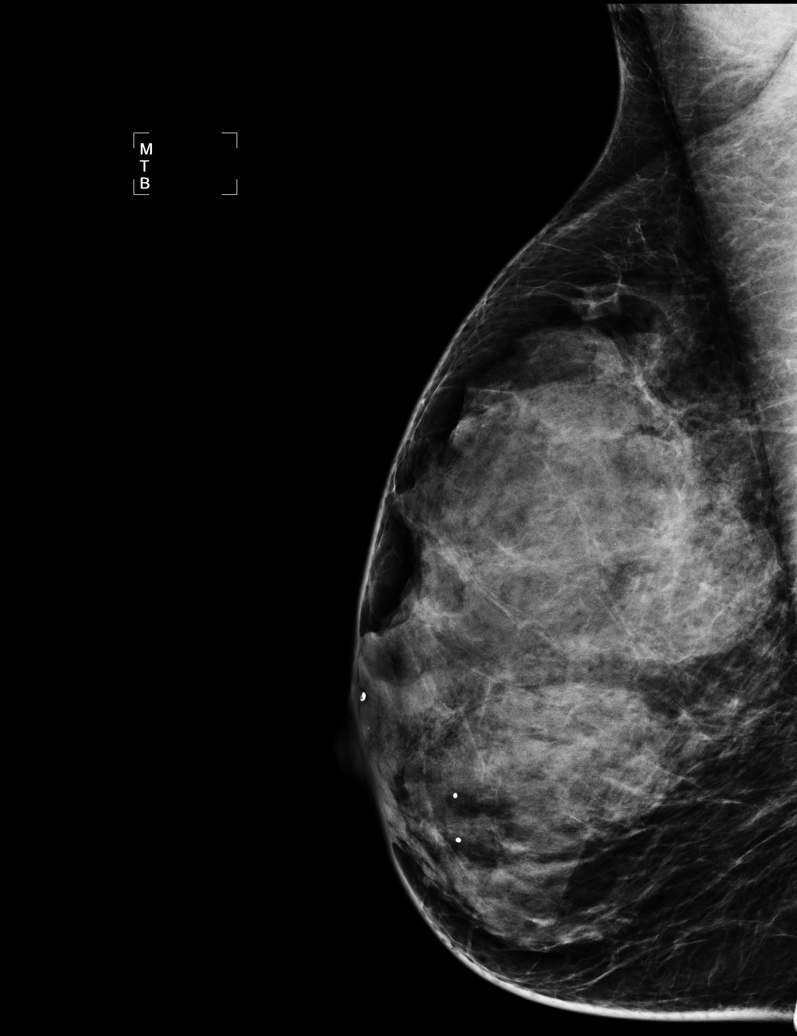

[L MLO (2 of 2)]
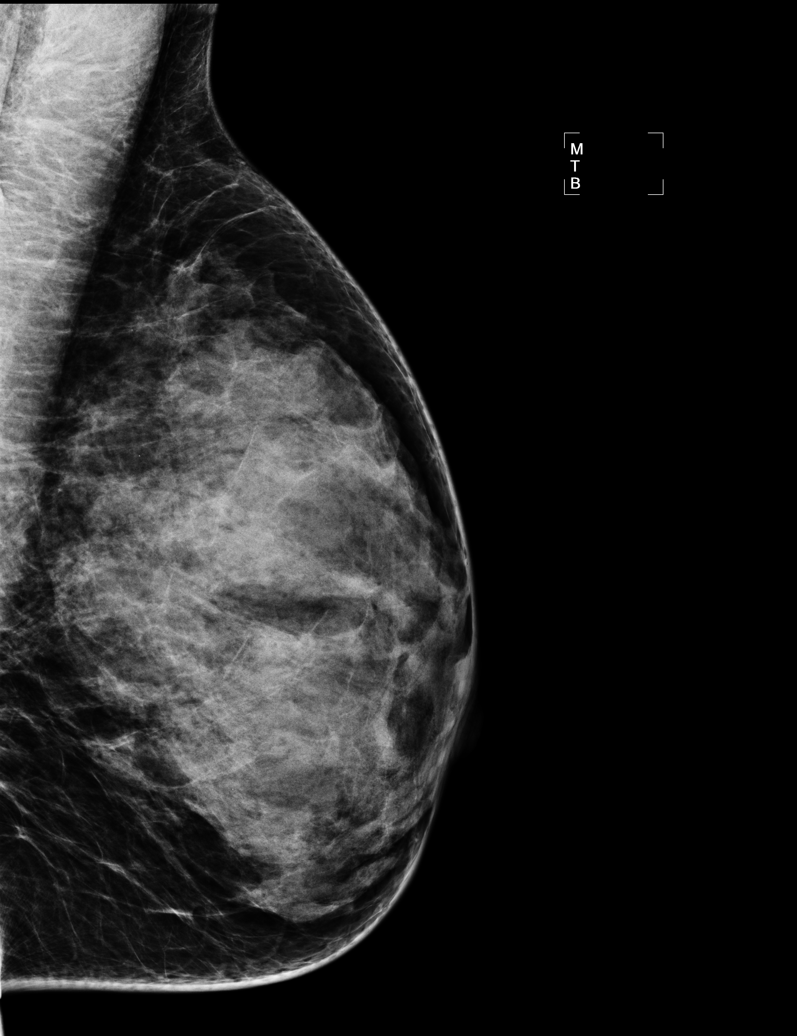

[6 of 6 positions shown; findings below may reference images not displayed]

ACR Breast Density Category d: The breast tissue is extremely dense,
which lowers the sensitivity of mammography.
FINDINGS: There are no findings suspicious for malignancy. Images were
processed with CAD.
IMPRESSION: No mammographic evidence of malignancy. A result letter of this
screening mammogram will be mailed directly to the patient.

RECOMMENDATION:
Screening mammogram in one year. (Code:BD-D-K0F)

BI-RADS CATEGORY  1: Negative.

## 2018-12-12 ENCOUNTER — Encounter (HOSPITAL_COMMUNITY): Payer: Self-pay | Admitting: Emergency Medicine

## 2018-12-12 ENCOUNTER — Emergency Department (HOSPITAL_COMMUNITY)
Admission: EM | Admit: 2018-12-12 | Discharge: 2018-12-12 | Disposition: A | Discharge status: Home / Self Care | Payer: BLUE CROSS/BLUE SHIELD | Attending: Emergency Medicine | Admitting: Emergency Medicine

## 2018-12-12 ENCOUNTER — Other Ambulatory Visit: Payer: Self-pay

## 2018-12-12 DIAGNOSIS — R112 Nausea with vomiting, unspecified: Secondary | ICD-10-CM | POA: Insufficient documentation

## 2018-12-12 DIAGNOSIS — Z20828 Contact with and (suspected) exposure to other viral communicable diseases: Secondary | ICD-10-CM | POA: Insufficient documentation

## 2018-12-12 DIAGNOSIS — R1084 Generalized abdominal pain: Secondary | ICD-10-CM | POA: Insufficient documentation

## 2018-12-12 DIAGNOSIS — Z20822 Contact with and (suspected) exposure to covid-19: Secondary | ICD-10-CM

## 2018-12-12 DIAGNOSIS — R197 Diarrhea, unspecified: Secondary | ICD-10-CM | POA: Insufficient documentation

## 2018-12-12 DIAGNOSIS — K2900 Acute gastritis without bleeding: Secondary | ICD-10-CM

## 2018-12-12 LAB — CBC
HCT: 45.8 % (ref 36.0–46.0)
Hemoglobin: 15.3 g/dL — ABNORMAL HIGH (ref 12.0–15.0)
MCH: 27.9 pg (ref 26.0–34.0)
MCHC: 33.4 g/dL (ref 30.0–36.0)
MCV: 83.4 fL (ref 80.0–100.0)
Platelets: 300 10*3/uL (ref 150–400)
RBC: 5.49 MIL/uL — ABNORMAL HIGH (ref 3.87–5.11)
RDW: 12.6 % (ref 11.5–15.5)
WBC: 7.8 10*3/uL (ref 4.0–10.5)
nRBC: 0 % (ref 0.0–0.2)

## 2018-12-12 LAB — COMPREHENSIVE METABOLIC PANEL
ALT: 18 U/L (ref 0–44)
AST: 23 U/L (ref 15–41)
Albumin: 4.5 g/dL (ref 3.5–5.0)
Alkaline Phosphatase: 86 U/L (ref 38–126)
Anion gap: 11 (ref 5–15)
BUN: <5 mg/dL — ABNORMAL LOW (ref 6–20)
CO2: 25 mmol/L (ref 22–32)
Calcium: 9.8 mg/dL (ref 8.9–10.3)
Chloride: 102 mmol/L (ref 98–111)
Creatinine, Ser: 0.87 mg/dL (ref 0.44–1.00)
GFR calc Af Amer: >60 mL/min (ref >60)
GFR calc non Af Amer: >60 mL/min (ref >60)
Glucose, Bld: 128 mg/dL — ABNORMAL HIGH (ref 70–99)
Potassium: 3.7 mmol/L (ref 3.5–5.1)
Sodium: 138 mmol/L (ref 135–145)
Total Bilirubin: 1 mg/dL (ref 0.3–1.2)
Total Protein: 8.1 g/dL (ref 6.5–8.1)

## 2018-12-12 LAB — LIPASE, BLOOD: Lipase: 21 U/L (ref 11–51)

## 2018-12-12 MED ORDER — ONDANSETRON 4 MG PO TBDP: 4.0000 mg | ORAL_TABLET | Freq: Three times a day (TID) | ORAL | 0 refills | Status: AC | PRN

## 2018-12-12 MED ORDER — FAMOTIDINE 20 MG PO TABS: 20.0000 mg | ORAL_TABLET | Freq: Two times a day (BID) | ORAL | 0 refills | Status: AC

## 2018-12-12 MED ORDER — MORPHINE SULFATE (PF) 4 MG/ML IV SOLN
4.0000 mg | Freq: Once | INTRAVENOUS | Status: AC
  Administered 2018-12-12: 4 mg via INTRAVENOUS
  Filled 2018-12-12: qty 1

## 2018-12-12 MED ORDER — FAMOTIDINE IN NACL 20-0.9 MG/50ML-% IV SOLN
20.0000 mg | Freq: Once | INTRAVENOUS | Status: AC
  Administered 2018-12-12: 20 mg via INTRAVENOUS
  Filled 2018-12-12: qty 50

## 2018-12-12 MED ORDER — SODIUM CHLORIDE 0.9% FLUSH: 3.0000 mL | Freq: Once | INTRAVENOUS | Status: DC

## 2018-12-12 MED ORDER — SODIUM CHLORIDE 0.9 % IV BOLUS
1000.0000 mL | Freq: Once | INTRAVENOUS | Status: AC
  Administered 2018-12-12: 1000 mL via INTRAVENOUS

## 2018-12-12 MED ORDER — ONDANSETRON HCL 4 MG/2ML IJ SOLN
4.0000 mg | Freq: Once | INTRAMUSCULAR | Status: AC
  Administered 2018-12-12: 4 mg via INTRAVENOUS
  Filled 2018-12-12: qty 2

## 2018-12-12 NOTE — ED Provider Notes (Signed)
[facility] Provider Note   CSN: [id number] Arrival date & time: 12/12/18  1454    History   Chief Complaint Chief Complaint  Patient presents with  . Abdominal Pain    HPI Stacy Risingerri V Boehle is a 53 y.o. female who presents to ED for generalized abdominal pain, diarrhea for the past 3 days.  She developed nonbloody, nonbilious emesis since yesterday x2.  She tried taking ibuprofen with only mild improvement in her symptoms.  No sick contacts with similar symptoms.  She cannot recall any inciting event that may have triggered the symptoms.  Describes the pain as aching, worse when she vomits.  No history of similar symptoms in the past.  Denies prior abdominal surgeries.  She reports occasional alcohol use but denies any tobacco or other drug use.  She was given Phenergan at an urgent care earlier today and had sent out COVID test done.  She denies any urinary symptoms or vaginal complaints.     HPI  Past Medical History:  Diagnosis Date  . Allergy    seasonal    There are no active problems to display for this patient.   Past Surgical History:  Procedure Laterality Date  . ABDOMINAL HYSTERECTOMY    . CESAREAN SECTION       OB History   No obstetric history on file.      Home Medications    Prior to Admission medications   Medication Sig Start Date End Date Taking? Authorizing Provider  amLODipine (NORVASC) 10 MG tablet Take 10 mg by mouth daily. 09/13/18  Yes [provider]  busPIRone (BUSPAR) 10 MG tablet Take 10 mg by mouth as needed for anxiety.   Yes [provider]  diphenhydramine-acetaminophen (TYLENOL PM) 25-500 MG TABS tablet Take 1 tablet by mouth at bedtime as needed.   Yes [provider]  ibuprofen (ADVIL) 200 MG tablet Take 400 mg by mouth every 6 (six) hours as needed for headache or moderate pain.   Yes [provider]  famotidine (PEPCID) 20 MG tablet Take 1 tablet (20 mg total)  by mouth 2 (two) times daily. 12/12/18   Swan Zayed, PA-C  ondansetron (ZOFRAN ODT) 4 MG disintegrating tablet Take 1 tablet (4 mg total) by mouth every 8 (eight) hours as needed for nausea or vomiting. 12/12/18   Dietrich PatesKhatri, Temika Sutphin, PA-C    Family History Family History  Problem Relation Age of Onset  . Diabetes Mother   . Cirrhosis Mother   . Heart disease Mother   . Heart disease Father   . Breast cancer Maternal Aunt   . Colon cancer Neg Hx     Social History Social History   Tobacco Use  . Smoking status: Never Smoker  . Smokeless tobacco: Never Used  Substance Use Topics  . Alcohol use: No  . Drug use: No     Allergies   Patient has no known allergies.   Review of Systems Review of Systems  Constitutional: Negative for appetite change, chills and fever.  HENT: Negative for ear pain, rhinorrhea, sneezing and sore throat.   Eyes: Negative for photophobia and visual disturbance.  Respiratory: Negative for cough, chest tightness, shortness of breath and wheezing.   Cardiovascular: Negative for chest pain and palpitations.  Gastrointestinal: Positive for abdominal pain, diarrhea, nausea and vomiting. Negative for blood in stool and constipation.  Genitourinary: Negative for dysuria, hematuria and urgency.  Musculoskeletal: Negative for myalgias.  Skin: Negative for rash.  Neurological:  Negative for dizziness, weakness and light-headedness.     Physical Exam Updated Vital Signs BP (!) 141/100   Pulse 80   Temp 98.4 F (36.9 C) (Oral)   Resp 14   SpO2 99%   Physical Exam Vitals signs and nursing note reviewed.  Constitutional:      General: She is not in acute distress.    Appearance: She is well-developed.  HENT:     Head: Normocephalic and atraumatic.     Nose: Nose normal.  Eyes:     General: No scleral icterus.       Left eye: No discharge.     Conjunctiva/sclera: Conjunctivae normal.  Neck:     Musculoskeletal: Normal range of motion and neck supple.   Cardiovascular:     Rate and Rhythm: Normal rate and regular rhythm.     Heart sounds: Normal heart sounds. No murmur. No friction rub. No gallop.   Pulmonary:     Effort: Pulmonary effort is normal. No respiratory distress.     Breath sounds: Normal breath sounds.  Abdominal:     General: Bowel sounds are normal. There is no distension.     Palpations: Abdomen is soft.     Tenderness: There is generalized abdominal tenderness. There is no guarding.  Musculoskeletal: Normal range of motion.  Skin:    General: Skin is warm and dry.     Findings: No rash.  Neurological:     Mental Status: She is alert.     Motor: No abnormal muscle tone.     Coordination: Coordination normal.      ED Treatments / Results  Labs (all labs ordered are listed, but only abnormal results are displayed) Labs Reviewed  COMPREHENSIVE METABOLIC PANEL - Abnormal; Notable for the following components:      Result Value   Glucose, Bld 128 (*)    BUN <5 (*)    All other components within normal limits  CBC - Abnormal; Notable for the following components:   RBC 5.49 (*)    Hemoglobin 15.3 (*)    All other components within normal limits  LIPASE, BLOOD    EKG None  Radiology No results found.  Procedures Procedures (including critical care time)  Medications Ordered in ED Medications  sodium chloride flush (NS) 0.9 % injection 3 mL (has no administration in time range)  sodium chloride 0.9 % bolus 1,000 mL (1,000 mLs Intravenous New Bag/Given 12/12/18 1658)  ondansetron (ZOFRAN) injection 4 mg (4 mg Intravenous Given 12/12/18 1658)  morphine 4 MG/ML injection 4 mg (4 mg Intravenous Given 12/12/18 1658)  famotidine (PEPCID) IVPB 20 mg premix (20 mg Intravenous New Bag/Given 12/12/18 1657)     Initial Impression / Assessment and Plan / ED Course  I have reviewed the triage vital signs and the nursing notes.  Pertinent labs & imaging results that were available during my care of the patient were  reviewed by me and considered in my medical decision making (see chart for details).        Stacy Coffey was evaluated in Emergency Department on 12/12/18  for the symptoms described in the history of present illness. He/she was evaluated in the context of the global COVID-19 pandemic, which necessitated consideration that the patient might be at risk for infection with the SARS-CoV-2 virus that causes COVID-19. Institutional protocols and algorithms that pertain to the evaluation of patients at risk for COVID-19 are in a state of rapid change based on information released by regulatory  bodies including the CDC and federal and state organizations. These policies and algorithms were followed during the patient's care in the ED.  53 year old female presents to ED for generalized abdominal pain, diarrhea for the past 3 days.  Started having nonbloody, nonbilious emesis since yesterday.  Has a cover test pending at urgent care earlier today.  Denies urinary symptoms or vaginal complaints.  Abdomen is generally tender without rebound or guarding.  No CVA tenderness.  Her vital signs are within normal limits.  Lab work including CBC, lipase, CMP unremarkable.  There is some hemoconcentration noted.  Patient given fluids, Pepcid, Zofran with resolution of her symptoms.  Repeat abdominal exams are benign.  I doubt appendicitis, cholecystitis, pancreatitis or other surgical or emergent cause of symptoms.  Suspect that symptoms are viral in nature.  She is requesting discharge home and is able to tolerate p.o. intake without difficulty.  We will have her quarantine until COVID test results in the meantime take antiemetics and Pepcid as needed and still advance her diet.  Patient agreeable to plan.  She knows to return to ED for worsening symptoms.  Patient is hemodynamically stable, in NAD, and able to ambulate in the ED. Evaluation does not show pathology that would require ongoing emergent intervention or  inpatient treatment. I explained the diagnosis to the patient. Pain has been managed and has no complaints prior to discharge. Patient is comfortable with above plan and is stable for discharge at this time. All questions were answered prior to disposition. Strict return precautions for returning to the ED were discussed. Encouraged follow up with PCP.   An After Visit Summary was printed and given to the patient.   Portions of this note were generated with Scientist, clinical (histocompatibility and immunogenetics)Dragon dictation software. Dictation errors may occur despite best attempts at proofreading.   Final Clinical Impressions(s) / ED Diagnoses   Final diagnoses:  Acute gastritis without hemorrhage, unspecified gastritis type  Suspected Covid-19 Virus Infection    ED Discharge Orders         Ordered    famotidine (PEPCID) 20 MG tablet  2 times daily     12/12/18 1841    ondansetron (ZOFRAN ODT) 4 MG disintegrating tablet  Every 8 hours PRN     12/12/18 1841           Dietrich PatesKhatri, Ermalinda Joubert, PA-C 12/12/18 1843    Milagros Lollykstra, Richard S, MD 12/13/18 [phone]

## 2018-12-12 NOTE — ED Notes (Signed)
Patient Alert and oriented to baseline. Stable and ambulatory to baseline. Patient verbalized understanding of the discharge instructions.  Patient belongings were taken by the patient.   

## 2018-12-12 NOTE — ED Triage Notes (Signed)
Pt c/o abdominal pain/nausea/vomiting/diarrhea x 3 days. Seen at UC earlier, send out COVID test done there.

## 2018-12-12 NOTE — Discharge Instructions (Addendum)
Take the following medications to help with your symptoms. You will need to slowly increase her diet as tolerated. Return to the ED if you start to have worsening symptoms, develop a fever, trouble breathing, trouble swallowing, vomiting or coughing up blood, blood in your stool or lightheadedness.

## 2019-11-30 ENCOUNTER — Other Ambulatory Visit: Payer: Self-pay | Admitting: Nurse Practitioner

## 2019-11-30 DIAGNOSIS — Z1231 Encounter for screening mammogram for malignant neoplasm of breast: Secondary | ICD-10-CM
